# Patient Record
Sex: Male | Born: 2000 | Race: Black or African American | Hispanic: No | Marital: Single | Smoking: Never smoker
Health system: Southern US, Community
[De-identification: ages and names within clinical notes are randomized; demographics above are authoritative.]

## PROBLEM LIST (undated history)

## (undated) DIAGNOSIS — F329 Major depressive disorder, single episode, unspecified: Secondary | ICD-10-CM

## (undated) DIAGNOSIS — Z889 Allergy status to unspecified drugs, medicaments and biological substances status: Secondary | ICD-10-CM

## (undated) DIAGNOSIS — Z9189 Other specified personal risk factors, not elsewhere classified: Secondary | ICD-10-CM

## (undated) DIAGNOSIS — F32A Depression, unspecified: Secondary | ICD-10-CM

## (undated) DIAGNOSIS — F319 Bipolar disorder, unspecified: Secondary | ICD-10-CM

## (undated) DIAGNOSIS — F909 Attention-deficit hyperactivity disorder, unspecified type: Secondary | ICD-10-CM

## (undated) DIAGNOSIS — F913 Oppositional defiant disorder: Secondary | ICD-10-CM

## (undated) DIAGNOSIS — R4689 Other symptoms and signs involving appearance and behavior: Secondary | ICD-10-CM

## (undated) HISTORY — PX: TONSILLECTOMY: SUR1361

---
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
## 2021-10-07 ENCOUNTER — Ambulatory Visit
Admission: EM | Admit: 2021-10-07 | Discharge: 2021-10-07 | Disposition: A | Discharge status: Home / Self Care | Payer: Medicaid Other | Attending: Urgent Care | Admitting: Urgent Care

## 2021-10-07 DIAGNOSIS — Z113 Encounter for screening for infections with a predominantly sexual mode of transmission: Secondary | ICD-10-CM | POA: Diagnosis present

## 2021-10-07 NOTE — ED Triage Notes (Signed)
Patient presents to Urgent Care for STD testing. Asymptomatic.  ?

## 2021-10-07 NOTE — Discharge Instructions (Signed)
You were tested today for gonorrhea, chlamydia, trichomonas. You were also tested for syphillis and HIV.  We will call you with the results of your test once received.  Please avoid all forms of intercourse until test results received, and if positive for any STI, all partners will need to complete entire course of antibiotics prior to resuming.  As always, practice safer sexual practices by using protection each and every time, and limiting number of partners.  

## 2021-10-07 NOTE — ED Provider Notes (Signed)
?[facility] ? ? ? ?CSN: [id number] ?Arrival date & time: 10/07/21  1820 ? ? ?  ? ?History   ?Chief Complaint ?Chief Complaint  ?Patient presents with  ? SEXUALLY TRANSMITTED DISEASE  ? ? ?HPI ?Wayne Mccann is a 21 y.o. male.  ? ?Pt presents today requesting STI testing. Has not had sexual contact in the past 7 months, but is hoping to become active with a new partner. Is requesting full testing. Denies any current symptoms or concerns. ? ? ? ?Past Medical History:  ?Diagnosis Date  ? ADHD (attention deficit hyperactivity disorder)   ? Bipolar 1 disorder (HCC)   ? Depression   ? H/O multiple allergies   ? Oppositional behavior   ? ? ?Patient Active Problem List  ? Diagnosis Date Noted  ? Moderate episode of recurrent major depressive disorder (HCC)   ? Nasal congestion 10/09/2015  ? DMDD (disruptive mood dysregulation disorder) (HCC) 10/08/2015  ? MDD (major depressive disorder) 10/06/2015  ? Oppositional defiant disorder 07/11/2015  ? Aggressive behavior of adolescent 07/11/2015  ? ? ?Past Surgical History:  ?Procedure Laterality Date  ? TONSILLECTOMY    ? ? ? ? ? ?Home Medications   ? ?Prior to Admission medications   ?Medication Sig Start Date End Date Taking? Authorizing Provider  ?diphenhydrAMINE (BENADRYL) 25 MG tablet Take 1 tablet (25 mg total) by mouth every 6 (six) hours as needed. ?Patient taking differently: Take 25 mg by mouth every 6 (six) hours as needed for itching or allergies. 02/20/17   Sharman CheekStafford, Phillip, MD  ?divalproex (DEPAKOTE) 250 MG DR tablet Take 250-500 mg by mouth 2 (two) times daily. 250 mg at 5 pm and 500 mg at bedtime 07/17/17   [provider]  ?divalproex (DEPAKOTE) 500 MG DR tablet TAKE 1 TABLET BY MOUTH EVERY DAY AT BEDTIME AS DIRECTED 01/26/18   [provider]  ?EPINEPHrine 0.3 mg/0.3 mL IJ SOAJ injection Inject 0.3 mg into the muscle as directed. 06/19/17   [provider]  ?hydrOXYzine (ATARAX/VISTARIL) 10 MG tablet TAKE 1 - 2 TABLET BY MOUTH  THREE TIMES A DAY AS NEEDED FOR ANXIETY/SLEEP 01/26/18   [provider]  ?lamoTRIgine (LAMICTAL) 25 MG tablet TAKE 1 TABLET BY MOUTH EVERY DAY IN THE MORNING AS DIRECTED 01/26/18   [provider]  ?meloxicam (MOBIC) 7.5 MG tablet Take 7.5 mg by mouth daily. 01/27/18   [provider]  ?naproxen (NAPROSYN) 375 MG tablet Take 1 tablet (375 mg total) by mouth 2 (two) times daily. 02/12/18   Wurst, [place], PA-C  ?oxymetazoline (AFRIN NASAL SPRAY) 0.05 % nasal spray Please apply 2 spray in affect nostril for nose bleeds. 08/27/17   Wieters, Hallie C, PA-C  ?risperiDONE (RISPERDAL) 0.5 MG tablet Take 1 tablet (0.5 mg total) by mouth 2 (two) times daily. 12/07/18   Mariel CraftMaurer, Sheila M, MD  ? ? ?Family History ?History reviewed. No pertinent family history. ? ?Social History ?Social History  ? ?Tobacco Use  ? Smoking status: Never  ? Smokeless tobacco: Never  ?Substance Use Topics  ? Alcohol use: No  ? Drug use: No  ? ? ? ?Allergies   ?Peanut-containing drug products, Pollen extract, Chocolate, and Shellfish allergy ? ? ?Review of Systems ?Review of Systems  ?All other systems reviewed and are negative. ? ? ?Physical Exam ?Triage Vital Signs ?ED Triage Vitals  ?Enc Vitals Group  ?   BP 10/07/21 1904 131/75  ?   Pulse Rate 10/07/21 1904 64  ?  Resp 10/07/21 1904 18  ?   Temp 10/07/21 1904 98.8 ?F (37.1 ?C)  ?   Temp Source 10/07/21 1904 Oral  ?   SpO2 10/07/21 1904 97 %  ?   Weight --   ?   Height --   ?   Head Circumference --   ?   Peak Flow --   ?   Pain Score 10/07/21 1944 0  ?   Pain Loc --   ?   Pain Edu? --   ?   Excl. in [place]? --   ? ?No data found. ? ?Updated Vital Signs ?BP 131/75 (BP Location: Left Arm)   Pulse 64   Temp 98.8 ?F (37.1 ?C) (Oral)   Resp 18   SpO2 97%  ? ?Visual Acuity ?Right Eye Distance:   ?Left Eye Distance:   ?Bilateral Distance:   ? ?Right Eye Near:   ?Left Eye Near:    ?Bilateral Near:    ? ?Physical Exam ?Vitals and nursing note reviewed.  ?Constitutional:   ?    General: He is not in acute distress. ?   Appearance: Normal appearance. He is well-developed. He is not ill-appearing, toxic-appearing or diaphoretic.  ?HENT:  ?   Head: Normocephalic and atraumatic.  ?Eyes:  ?   Conjunctiva/sclera: Conjunctivae normal.  ?Cardiovascular:  ?   Rate and Rhythm: Normal rate.  ?Pulmonary:  ?   Effort: Pulmonary effort is normal.  ?Abdominal:  ?   General: Abdomen is flat.  ?   Palpations: Abdomen is soft.  ?Musculoskeletal:  ?   Cervical back: Neck supple.  ?Skin: ?   General: Skin is warm and dry.  ?   Findings: No erythema or rash.  ?Neurological:  ?   General: No focal deficit present.  ?   Mental Status: He is alert and oriented to person, place, and time.  ?Psychiatric:     ?   Mood and Affect: Mood normal.  ? ? ? ?UC Treatments / Results  ?Labs ?(all labs ordered are listed, but only abnormal results are displayed) ?Labs Reviewed  ?HIV ANTIBODY (ROUTINE TESTING W REFLEX)  ?RPR  ?CYTOLOGY, (ORAL, ANAL, URETHRAL) ANCILLARY ONLY  ? ? ?EKG ? ? ?Radiology ?No results found. ? ?Procedures ?Procedures (including critical care time) ? ?Medications Ordered in UC ?Medications - No data to display ? ?Initial Impression / Assessment and Plan / UC Course  ?I have reviewed the triage vital signs and the nursing notes. ? ?Pertinent labs & imaging results that were available during my care of the patient were reviewed by me and considered in my medical decision making (see chart for details). ? ?  ? ?STD testing - urethral swab and labs obtained today. Due to lack of symptoms and no known exposures, empiric treatment not recommended. Will contact pt with results.  ? ?Final Clinical Impressions(s) / UC Diagnoses  ? ?Final diagnoses:  ?Screen for STD (sexually transmitted disease)  ? ? ? ?Discharge Instructions   ? ?  ?You were tested today for gonorrhea, chlamydia, trichomonas. ?You were also tested for syphillis and HIV. ?We will call you with the results of your test once received. ? ?Please  avoid all forms of intercourse until test results received, and if positive for any STI, all partners will need to complete entire course of antibiotics prior to resuming. ?As always, practice safer sexual practices by using protection each and every time, and limiting number of partners.  ? ? ? ?ED Prescriptions   ?  None ?  ? ?PDMP not reviewed this encounter. ?  Maretta Bees, [place] ?10/07/21 2033 ? ?

## 2021-10-09 LAB — CYTOLOGY, (ORAL, ANAL, URETHRAL) ANCILLARY ONLY
Chlamydia: NEGATIVE
Comment: NEGATIVE
Comment: NEGATIVE
Comment: NORMAL
Neisseria Gonorrhea: NEGATIVE
Trichomonas: NEGATIVE

## 2021-10-09 LAB — HIV ANTIBODY (ROUTINE TESTING W REFLEX): HIV Screen 4th Generation wRfx: NONREACTIVE

## 2021-10-09 LAB — RPR: RPR Ser Ql: NONREACTIVE
